# Patient Record
Sex: Male | Born: 1985 | Race: White | Hispanic: No | Marital: Married | Smoking: Never smoker
Health system: Southern US, Community
[De-identification: ages and names within clinical notes are randomized; demographics above are authoritative.]

## PROBLEM LIST (undated history)

## (undated) ENCOUNTER — Ambulatory Visit (HOSPITAL_COMMUNITY): Discharge status: Absent without leave | Payer: Self-pay

---
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
## 2012-08-09 ENCOUNTER — Emergency Department (HOSPITAL_COMMUNITY)
Admission: EM | Admit: 2012-08-09 | Discharge: 2012-08-09 | Disposition: A | Discharge status: Home / Self Care | Payer: BC Managed Care – PPO | Source: Home / Self Care | Attending: Family Medicine | Admitting: Family Medicine

## 2012-08-09 ENCOUNTER — Encounter (HOSPITAL_COMMUNITY): Payer: Self-pay

## 2012-08-09 DIAGNOSIS — IMO0002 Reserved for concepts with insufficient information to code with codable children: Secondary | ICD-10-CM

## 2012-08-09 DIAGNOSIS — R112 Nausea with vomiting, unspecified: Secondary | ICD-10-CM

## 2012-08-09 DIAGNOSIS — T733XXA Exhaustion due to excessive exertion, initial encounter: Secondary | ICD-10-CM

## 2012-08-09 LAB — POCT INFECTIOUS MONO SCREEN: Mono Screen: NEGATIVE

## 2012-08-09 NOTE — ED Notes (Signed)
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

## 2012-08-09 NOTE — ED Provider Notes (Signed)
History     CSN: [id number]  Arrival date & time 08/09/12  1005   First MD Initiated Contact with Patient 08/09/12 1140      Chief Complaint  Patient presents with  . Lymphadenopathy    (Consider location/radiation/quality/duration/timing/severity/associated sxs/prior treatment) The history is provided by the patient.   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  States no previous illness or feeling sick. States symptoms have since resolved.   History reviewed. No pertinent past medical history.  History reviewed. No pertinent past surgical history.  History reviewed. No pertinent family history.  History  Substance Use Topics  . Smoking status: Never Smoker   . Smokeless tobacco: Not on file  . Alcohol Use: Yes      Review of Systems  Constitutional: Negative.   Respiratory: Negative.   Cardiovascular: Negative.   Gastrointestinal: Negative.   Genitourinary: Negative.     Allergies  Review of patient's allergies indicates no known allergies.  Home Medications  No current outpatient prescriptions on file.  BP 136/93  Pulse 110  Temp 97.7 F (36.5 C) (Oral)  Resp 19  SpO2 100%  Physical Exam  Nursing note and vitals reviewed. Constitutional: He is oriented to person, place, and time. Vital signs are normal. He appears well-developed and well-nourished. He is active and cooperative.  HENT:  Head: Normocephalic.  Right Ear: Hearing, tympanic membrane, external ear and ear canal normal.  Left Ear: Hearing, tympanic membrane, external ear and ear canal normal.  Nose: Nose normal. Right sinus exhibits no maxillary sinus tenderness and no frontal sinus tenderness. Left sinus exhibits no maxillary sinus tenderness and no frontal sinus tenderness.  Eyes: Conjunctivae normal are normal. Pupils are equal, round, and reactive to light. No scleral icterus.  Neck:  Trachea normal and normal range of motion. Neck supple. No spinous process tenderness and no muscular tenderness present.  Cardiovascular: Normal rate, regular rhythm, normal heart sounds and normal pulses.   Pulmonary/Chest: Effort normal and breath sounds normal.  Abdominal: Soft. Normal appearance. There is no tenderness.  Lymphadenopathy:       Head (right side): No submental, no submandibular, no tonsillar, no preauricular, no posterior auricular and no occipital adenopathy present.       Head (left side): No submental, no submandibular, no tonsillar, no preauricular, no posterior auricular and no occipital adenopathy present.    He has no cervical adenopathy.  Neurological: He is alert and oriented to person, place, and time. He has normal strength. No cranial nerve deficit or sensory deficit. Coordination and gait normal. GCS eye subscore is 4. GCS verbal subscore is 5. GCS motor subscore is 6.  Skin: Skin is warm and dry. No rash noted.  Psychiatric: He has a normal mood and affect. His speech is normal and behavior is normal. Judgment and thought content normal. Cognition and memory are normal.    ED Course  Procedures (including critical care time)   Labs Reviewed  POCT INFECTIOUS MONO SCREEN   No results found.   1. Exhaustion due to excessive exertion   2. Nausea & vomiting       MDM  Increase flluid intake.  Recommended Carbs and Protein pre work out to prevent adverse reactions.  May take tylenol or motrin for muscle aches.          Johnsie Kindred, NP 08/09/12 2100

## 2012-08-09 NOTE — Discharge Instructions (Signed)
Overtraining Many athletes have one or more episodes of overtraining during their career. Overtraining can be a serious problem. It often has a negative effect on your performance. This condition is hard to diagnose. It is related to declining performance, fatigue, altered mood, muscle soreness, and a feeling of being burned out. The extent of the condition varies between athletes. Symptoms may have different origins: mind (psychological), body (physiological), or mind and body (psychosomatic). SYMPTOMS   Declining performance.   Fatigue.   Tension.   Sleep problems.   Muscle fatigue.   Anger.   Loss of appetite.   Loss of sexual interest.   Irregular menstrual periods.   Depression.   Abnormal sense perceptions.  CAUSES   High intensity, frequency, or duration of physical training.   Sleep problems.   Travel.   Use of certain medicines.   Drinking alcohol.  RISK INCREASES WITH:   Poor general health.   Poor general nutrition.   Mood state.   Type A personality.   Age.   Males.   Menstrual cycle.  PREVENTION It is important to include alternating aspects of training (periodization) in your training schedule. This reduces the risk of overtraining. Periodization includes planned changes in exercise volume that are coordinated with dates of competitions. This method maximizes performance and minimizes the risk of overtraining. Coaches often set such schedules with their athletes. However, if you design your own training schedule, it is important to use a similar alternating method. TREATMENT  The best way to treat overtraining is by not allowing it to happen. Plan training schedules that include changes in exercise volume. The only way to treat an existing case of overtraining is to rest. Rest is advised for about 2 weeks. Once activity is resumed, you should begin with a low exercise volume. Increase the frequency, intensity and length of exercise gradually. If  symptoms begin again during recovery, it is important to take a step back in your recovery progression. It is also important to seek medical attention, to rule out other medical conditions that may be the cause of your symptoms (malnutrition, depression, thyroid disease, anemia).  Document Released: 11/12/2005 Document Revised: 11/01/2011 Document Reviewed: 02/24/2009 [facility] Patient Information 2012 [place], [place].

## 2012-08-10 NOTE — ED Provider Notes (Signed)
Medical screening examination/treatment/procedure(s) were performed by resident physician or non-physician practitioner and as supervising physician I was immediately available for consultation/collaboration.   Barkley Bruns MD.    Linna Hoff, MD 08/10/12 [phone]

## 2013-10-05 ENCOUNTER — Ambulatory Visit (INDEPENDENT_AMBULATORY_CARE_PROVIDER_SITE_OTHER): Payer: BC Managed Care – PPO | Admitting: Physician Assistant

## 2013-10-05 VITALS — BP 118/62 | HR 71 | Temp 97.7°F | Resp 16 | Ht 72.0 in | Wt 140.0 lb

## 2013-10-05 DIAGNOSIS — Z0000 Encounter for general adult medical examination without abnormal findings: Secondary | ICD-10-CM

## 2013-10-05 NOTE — Progress Notes (Signed)
Patient ID: Benjamin Kent MRN: [id number], DOB: 1986/10/31 26 y.o. Date of Encounter: 10/05/2013, 12:15 PM  Primary Physician: No PCP Per Patient  Chief Complaint: Physical (CPE)  HPI: 27 y.o. male with history noted below here for CPE. Doing well. No issues/complaints. 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 No issues with respirator. Never a smoker. Generally healthy. Spent 3 years in the [employer]. Majored in criminal justice at [employer].   Review of Systems: Consitutional: No fever, chills, fatigue, night sweats, lymphadenopathy, or weight changes. Eyes: No visual changes, eye redness, or discharge. ENT/Mouth: Ears: No otalgia, tinnitus, hearing loss, discharge. Nose: No congestion, rhinorrhea, sinus pain, or epistaxis. Throat: No sore throat, post nasal drip, or teeth pain. Cardiovascular: No CP, palpitations, diaphoresis, DOE, edema, orthopnea, PND. Respiratory: No cough, hemoptysis, SOB, or wheezing. Gastrointestinal: No anorexia, dysphagia, reflux, pain, nausea, vomiting, hematemesis, diarrhea, constipation, BRBPR, or melena. Genitourinary: No dysuria, frequency, urgency, hematuria, incontinence, nocturia, decreased urinary stream, discharge, impotence, or testicular pain/masses. Musculoskeletal: No decreased ROM, myalgias, stiffness, joint swelling, or weakness. Skin: No rash, erythema, lesion changes, pain, warmth, jaundice, or pruritis. Neurological: No headache, dizziness, syncope, seizures, tremors, memory loss, coordination problems, or paresthesias. Psychological: No anxiety, depression, hallucinations, SI/HI. Endocrine: No fatigue, polydipsia, polyphagia, polyuria, or known diabetes.   History reviewed. No pertinent past medical history.   History reviewed. No pertinent past surgical history.  Home Meds:  Prior to Admission  medications   Not on File    Allergies: No Known Allergies  History   Social History  . Marital Status: Single    Spouse Name: N/A    Number of Children: N/A  . Years of Education: N/A   Occupational History  . Not on file.   Social History Main Topics  . Smoking status: Never Smoker   . Smokeless tobacco: Not on file  . Alcohol Use: Yes  . Drug Use: No  . Sexual Activity: Not on file   Other Topics Concern  . Not on file   Social History Narrative  . No narrative on file    Family History  Problem Relation Age of Onset  . Hyperlipidemia Father   . Diabetes Maternal Grandmother     Physical Exam: Blood pressure 118/62, pulse 71, temperature 97.7 F (36.5 C), temperature source Oral, resp. rate 16, height 6' (1.829 m), weight 140 lb (63.504 kg).  General: Well developed, well nourished, in no acute distress. HEENT: Normocephalic, atraumatic. Conjunctiva pink, sclera non-icteric. Pupils 2 mm constricting to 1 mm, round, regular, and equally reactive to light and accomodation. EOMI. Internal auditory canal clear. TMs with good cone of light and without pathology. Nasal mucosa pink. Nares are without discharge. No sinus tenderness. Oral mucosa pink. Dentition normal. Pharynx without exudate.   Neck: Supple. Trachea midline. No thyromegaly. Full ROM. No lymphadenopathy. Lungs: Clear to auscultation bilaterally without wheezes, rales, or rhonchi. Breathing is of normal effort and unlabored. Cardiovascular: RRR with S1 S2. No murmurs, rubs, or gallops appreciated. Distal pulses 2+ symmetrically. No carotid or abdominal bruits. Abdomen: Soft, non-tender, non-distended with normoactive bowel sounds. No hepatosplenomegaly or masses. No rebound/guarding. No CVA tenderness. Without hernias.  Genitourinary: Circumcised male. No penile lesions. Testes descended bilaterally, and smooth without tenderness or masses.  Musculoskeletal: Full range of motion and 5/5 strength throughout.  Without swelling, atrophy, tenderness, crepitus, or warmth. Extremities without  clubbing, cyanosis, or edema. Calves supple. Skin: Warm and moist without erythema, ecchymosis, wounds, or rash. Neuro: A+Ox3. CN II-XII grossly intact. Moves all extremities spontaneously. Full sensation throughout. Normal gait. DTR 2+ throughout upper and lower extremities. Finger to nose intact. Psych:  Responds to questions appropriately with a normal affect.   Studies:   Declines labs. 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 Peak flow: 650. Predicted approximately 650.   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  -Healthy young adult male physical -Peak flow unremarkable -Spirometry unremarkable -Healthy diet and exercise -Smart lifestyle choices -Age appropriate anticipatory guidance  Signed, Eula Listen, PA-C [facility] [place], [place] [address] [phone] 10/05/2013 12:15 PM

## 2016-01-03 ENCOUNTER — Ambulatory Visit
Admission: RE | Admit: 2016-01-03 | Discharge: 2016-01-03 | Disposition: A | Discharge status: Home / Self Care | Payer: No Typology Code available for payment source | Source: Ambulatory Visit | Attending: Occupational Medicine | Admitting: Occupational Medicine

## 2016-01-03 ENCOUNTER — Other Ambulatory Visit: Payer: Self-pay | Admitting: Occupational Medicine

## 2016-01-03 DIAGNOSIS — Z021 Encounter for pre-employment examination: Secondary | ICD-10-CM

## 2016-06-15 ENCOUNTER — Ambulatory Visit (HOSPITAL_COMMUNITY)
Admission: EM | Admit: 2016-06-15 | Discharge: 2016-06-15 | Disposition: A | Discharge status: Home / Self Care | Payer: 59 | Attending: Family Medicine | Admitting: Family Medicine

## 2016-06-15 ENCOUNTER — Encounter (HOSPITAL_COMMUNITY): Payer: Self-pay | Admitting: Emergency Medicine

## 2016-06-15 DIAGNOSIS — L03114 Cellulitis of left upper limb: Secondary | ICD-10-CM

## 2016-06-15 MED ORDER — ONDANSETRON 8 MG PO TBDP: 8.0000 mg | ORAL_TABLET | Freq: Three times a day (TID) | ORAL | Status: DC | PRN

## 2016-06-15 MED ORDER — CEFTRIAXONE SODIUM 1 G IJ SOLR
INTRAMUSCULAR | Status: AC
  Filled 2016-06-15: qty 10

## 2016-06-15 MED ORDER — LIDOCAINE HCL (PF) 1 % IJ SOLN
INTRAMUSCULAR | Status: AC
  Filled 2016-06-15: qty 2

## 2016-06-15 MED ORDER — CEFTRIAXONE SODIUM 1 G IJ SOLR
1.0000 g | Freq: Once | INTRAMUSCULAR | Status: AC
  Administered 2016-06-15: 1 g via INTRAMUSCULAR

## 2016-06-15 MED ORDER — ONDANSETRON 4 MG PO TBDP
ORAL_TABLET | ORAL | Status: AC
  Filled 2016-06-15: qty 2

## 2016-06-15 MED ORDER — ONDANSETRON 4 MG PO TBDP
4.0000 mg | ORAL_TABLET | Freq: Once | ORAL | Status: AC
  Administered 2016-06-15: 4 mg via ORAL

## 2016-06-15 NOTE — Discharge Instructions (Signed)

## 2016-06-15 NOTE — ED Notes (Signed)
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 The patient stated that he did see city medical services and was prescribed an oral antibiotic that he could not remember the name of the antibiotic. He stated that this am he started having nausea.

## 2016-06-17 ENCOUNTER — Encounter (HOSPITAL_COMMUNITY): Payer: Self-pay | Admitting: Emergency Medicine

## 2016-06-17 ENCOUNTER — Ambulatory Visit (HOSPITAL_COMMUNITY)
Admission: EM | Admit: 2016-06-17 | Discharge: 2016-06-17 | Disposition: A | Discharge status: Home / Self Care | Payer: 59 | Attending: Physician Assistant | Admitting: Physician Assistant

## 2016-06-17 DIAGNOSIS — L0291 Cutaneous abscess, unspecified: Secondary | ICD-10-CM

## 2016-06-17 DIAGNOSIS — L02414 Cutaneous abscess of left upper limb: Secondary | ICD-10-CM | POA: Insufficient documentation

## 2016-06-17 MED ORDER — LIDOCAINE HCL 2 % IJ SOLN
INTRAMUSCULAR | Status: AC
  Filled 2016-06-17: qty 20

## 2016-06-17 NOTE — ED Notes (Signed)
Applied dressing to left forearm.

## 2016-06-17 NOTE — ED Provider Notes (Signed)
CSN: [id number]     Arrival date & time 06/17/16  1158 History   First MD Initiated Contact with Patient 06/17/16 1214     Chief Complaint  Patient presents with  . 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 Initially treated with ceftriaxone and a course of Bactrim. Patient states that the redness in the arm has receded quite a bit. Other symptoms include: No fever Pain score: 3 FAMILY HISTORY: Hypertension    History reviewed. No pertinent past medical history. History reviewed. No pertinent surgical history. Family History  Problem Relation Age of Onset  . Hyperlipidemia Father   . Diabetes Maternal Grandmother    Social History  Substance Use Topics  . Smoking status: Never Smoker  . Smokeless tobacco: Never Used  . Alcohol use Yes    Review of Systems  Denies: HEADACHE, NAUSEA, ABDOMINAL PAIN, CHEST PAIN, CONGESTION, DYSURIA, SHORTNESS OF BREATH  Allergies  Review of patient's allergies indicates no known allergies.  Home Medications   Prior to Admission medications   Medication Sig Start Date End Date Taking? Authorizing Provider  bacitracin 500 UNIT/GM ointment Apply 1 application topically 2 (two) times daily.    Historical Provider, MD  ondansetron (ZOFRAN ODT) 8 MG disintegrating tablet Take 1 tablet (8 mg total) by mouth every 8 (eight) hours as needed for nausea or vomiting. 06/15/16   Deatra Canter, FNP  traMADol (ULTRAM) 50 MG tablet Take by mouth every 6 (six) hours as needed.    Historical Provider, MD   Meds Ordered and Administered this Visit  Medications - No data to display  BP 133/75 (BP Location:  Right Arm)   Pulse 77   Temp 98.3 F (36.8 C)   Resp 16   Ht 6\' 2"  (1.88 m)   Wt 160 lb (72.6 kg)   SpO2 99%   BMI 20.54 kg/m  No data found.   Physical Exam   NURSES NOTES AND VITAL SIGNS REVIEWED. CONSTITUTIONAL: Well developed, well nourished, no acute distress HEENT: normocephalic, atraumatic EYES: Conjunctiva normal NECK:normal ROM, supple, no adenopathy PULMONARY:No respiratory distress, normal effort ABDOMINAL: Soft, ND, NT BS+, No CVAT MUSCULOSKELETAL: Normal ROM of all extremities, Left forearm, tender red swollen lesion measures about 3 cm fluctuant.  SKIN: warm and dry without rash PSYCHIATRIC: Mood and affect, behavior are normal  Urgent Care Course   Clinical Course    .Marland KitchenIncision and Drainage Date/Time: 06/17/2016 12:55 PM Performed by: Tharon Aquas Authorized by: Barbra Sarks C   Consent:    Consent obtained:  Verbal   Consent given by:  Patient   Alternatives discussed:  Observation Location:    Type:  Abscess   Location:  Upper extremity   Upper extremity location:  Arm   Arm location:  L lower arm Pre-procedure details:    Skin preparation:  Antiseptic wash and Betadine Anesthesia (see MAR for exact dosages):    Anesthesia method:  Local infiltration   Local anesthetic:  Lidocaine 1% WITH epi Procedure type:    Complexity:  Simple Procedure details:    Needle aspiration: no     Incision types:  Single straight   Incision depth:  Subcutaneous   Scalpel blade:  11  Wound management:  Probed and deloculated   Drainage:  Purulent   Drainage amount:  Moderate   Wound treatment:  Drain placed   Packing materials:  1/4 in gauze Post-procedure details:    Patient tolerance of procedure:  Tolerated well, no immediate complications   (including critical care time)  Labs Review Labs Reviewed  AEROBIC CULTURE (SUPERFICIAL SPECIMEN)    Imaging Review No results found.   Visual Acuity Review  Right Eye Distance:   Left Eye  Distance:   Bilateral Distance:    Right Eye Near:   Left Eye Near:    Bilateral Near:      Continue antibiotics, hot compresses to arm, keep covered.  Packing to be removed in 3 days.    MDM   1. Abscess     Patient is reassured that there are no issues that require transfer to higher level of care at this time or additional tests. Patient is advised to continue home symptomatic treatment. Patient is advised that if there are new or worsening symptoms to attend the emergency department, contact primary care provider, or return to UC. Instructions of care provided discharged home in stable condition.    THIS NOTE WAS GENERATED USING A VOICE RECOGNITION SOFTWARE PROGRAM. ALL REASONABLE EFFORTS  WERE MADE TO PROOFREAD THIS DOCUMENT FOR ACCURACY.  I have verbally reviewed the discharge instructions with the patient. A printed AVS was given to the patient.  All questions were answered prior to discharge.      Tharon Aquas, PA 06/17/16 1256

## 2016-06-17 NOTE — ED Triage Notes (Signed)
Pt. Here for cellulitis, here for recheck.

## 2016-06-20 LAB — AEROBIC CULTURE  (SUPERFICIAL SPECIMEN)

## 2016-06-20 LAB — AEROBIC CULTURE W GRAM STAIN (SUPERFICIAL SPECIMEN)

## 2016-06-21 ENCOUNTER — Telehealth (HOSPITAL_COMMUNITY): Payer: Self-pay | Admitting: *Deleted

## 2016-08-19 ENCOUNTER — Ambulatory Visit (HOSPITAL_COMMUNITY)
Admission: EM | Admit: 2016-08-19 | Discharge: 2016-08-19 | Disposition: A | Discharge status: Home / Self Care | Payer: 59 | Attending: Internal Medicine | Admitting: Internal Medicine

## 2016-08-19 ENCOUNTER — Encounter (HOSPITAL_COMMUNITY): Payer: Self-pay | Admitting: Emergency Medicine

## 2016-08-19 DIAGNOSIS — S46912A Strain of unspecified muscle, fascia and tendon at shoulder and upper arm level, left arm, initial encounter: Secondary | ICD-10-CM | POA: Diagnosis not present

## 2016-08-19 DIAGNOSIS — S46812A Strain of other muscles, fascia and tendons at shoulder and upper arm level, left arm, initial encounter: Secondary | ICD-10-CM

## 2016-08-19 MED ORDER — METHOCARBAMOL 500 MG PO TABS: 500.0000 mg | ORAL_TABLET | Freq: Three times a day (TID) | ORAL | 0 refills | Status: DC | PRN

## 2016-08-19 MED ORDER — KETOROLAC TROMETHAMINE 30 MG/ML IJ SOLN
INTRAMUSCULAR | Status: AC
  Filled 2016-08-19: qty 1

## 2016-08-19 MED ORDER — METHYLPREDNISOLONE ACETATE 80 MG/ML IJ SUSP
80.0000 mg | Freq: Once | INTRAMUSCULAR | Status: AC
  Administered 2016-08-19: 80 mg via INTRAMUSCULAR

## 2016-08-19 MED ORDER — METHYLPREDNISOLONE 4 MG PO TBPK: ORAL_TABLET | ORAL | 0 refills | Status: DC

## 2016-08-19 MED ORDER — METHYLPREDNISOLONE ACETATE 80 MG/ML IJ SUSP
INTRAMUSCULAR | Status: AC
  Filled 2016-08-19: qty 1

## 2016-08-19 MED ORDER — KETOROLAC TROMETHAMINE 30 MG/ML IJ SOLN
30.0000 mg | Freq: Once | INTRAMUSCULAR | Status: AC
  Administered 2016-08-19: 30 mg via INTRAMUSCULAR

## 2016-08-19 NOTE — ED Triage Notes (Signed)
[facility] a [facility] of right shoulder pain secondary to lifting weights in the gym 3 days ago. The patient stated that he was doing incline presses and felt a "pop" in his shoulder. The patient stated that the pain starts in his neck on the left side and radiates into his left shoulder.

## 2016-08-19 NOTE — Discharge Instructions (Signed)
No heavy lifting.    Can continue using heat or ice.    OTC tylenol as directed.

## 2016-08-19 NOTE — ED Provider Notes (Signed)
[facility]    CSN: [id number] Arrival date & time: 08/19/16  1205  First Provider Contact:  First MD Initiated Contact with Patient 08/19/16 1307        History   Chief Complaint Chief Complaint  Patient presents with  . Shoulder Pain    HPI Benjamin Kent is a 30 y.o. male.   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  States that he was in the gym doing incline presses Friday and he felt a "pop" in the left scapular area.  Pain aggravated with turning his head and neck flexion/extension.  Denies left shoulder pain or cervical radiculopathy. No previous problems before onset.  Has used heat/ice, OTC nsaid's, tylenol without much improvement.     History reviewed. No pertinent past medical history.  There are no active problems to display for this patient.   History reviewed. No pertinent surgical history.     Home Medications    Prior to Admission medications   Medication Sig Start Date End Date Taking? Authorizing Provider  bacitracin 500 UNIT/GM ointment Apply 1 application topically 2 (two) times daily.    Historical Provider, MD  ondansetron (ZOFRAN ODT) 8 MG disintegrating tablet Take 1 tablet (8 mg total) by mouth every 8 (eight) hours as needed for nausea or vomiting. 06/15/16   Deatra CanterWilliam J Oxford, FNP  traMADol (ULTRAM) 50 MG tablet Take by mouth every 6 (six) hours as needed.    Historical Provider, MD    Family History Family History  Problem Relation Age of Onset  . Hyperlipidemia Father   . Diabetes Maternal Grandmother     Social History Social History  Substance Use Topics  . Smoking status: Never Smoker  . Smokeless tobacco: Never Used  . Alcohol use Yes     Allergies   Review of patient's allergies indicates no known allergies.   Review of Systems Review of Systems  Constitutional: Negative.   HENT: Negative.   Eyes: Negative.   Respiratory: Negative.    Cardiovascular: Negative.   Gastrointestinal: Negative.   Genitourinary: Negative.   Skin: Negative.   Neurological: Negative.   Psychiatric/Behavioral: Negative.      Physical Exam Triage Vital Signs ED Triage Vitals [08/19/16 1236]  Enc Vitals Group     BP 124/76     Pulse Rate 72     Resp 18     Temp 98.5 F (36.9 C)     Temp Source Oral     SpO2 100 %     Weight      Height      Head Circumference      Peak Flow      Pain Score 7     Pain Loc      Pain Edu?      Excl. in GC?    No data found.   Updated Vital Signs BP 124/76 (BP Location: Left Arm)   Pulse 72   Temp 98.5 F (36.9 C) (Oral)   Resp 18   SpO2 100%   Visual Acuity Right Eye Distance:   Left Eye Distance:   Bilateral Distance:    Right Eye Near:   Left Eye Near:    Bilateral Near:     Physical Exam  Constitutional: He is oriented to person, place, and time. He appears well-developed. No distress.  HENT:  Head: Normocephalic and atraumatic.  Eyes: EOM are normal. Pupils are equal,  round, and reactive to light.  Neck:  Some limitation with Cervical spine ROM due to left trapezius and scapular soreness.    Pulmonary/Chest: Effort normal. No respiratory distress.  Abdominal: He exhibits no distension.  Musculoskeletal: He exhibits no deformity.  Moderately tender over the left trapezius muscle and medial left scapular border with spasm.  bilat shoulders good ROM.  Neg impingement test.  Neg apprehension.  NVI.  No focal motor deficits.   Neurological: He is alert and oriented to person, place, and time.  Skin: Skin is warm and dry.  Psychiatric: He has a normal mood and affect.     UC Treatments / Results  Labs (all labs ordered are listed, but only abnormal results are displayed) Labs Reviewed - No data to display  EKG  EKG Interpretation None       Radiology No results found.  Procedures Procedures (including critical care time)  Medications Ordered in UC Medications    ketorolac (TORADOL) 30 MG/ML injection 30 mg (not administered)  methylPREDNISolone acetate (DEPO-MEDROL) injection 80 mg (not administered)     Initial Impression / Assessment and Plan / UC Course  I have reviewed the triage vital signs and the nursing notes.  Pertinent labs & imaging results that were available during my care of the patient were reviewed by me and considered in my medical decision making (see chart for details).  Clinical Course      Final Clinical Impressions(s) / UC Diagnoses   Final diagnoses:  Muscle strain of left scapular region, initial encounter  Strain of left trapezius muscle, initial encounter    New Prescriptions New Prescriptions   METHOCARBAMOL (ROBAXIN) 500 MG TABLET    Take 1 tablet (500 mg total) by mouth every 8 (eight) hours as needed for muscle spasms.   METHYLPREDNISOLONE (MEDROL DOSEPAK) 4 MG TBPK TABLET    6 day taper to be taken as directed.  Start 25 September.   Patient given IM toradol and depomedrol injections today.  Also given the above scripts. Start prednisone taper tomorrow.  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  He will call [facility] Monday morning to schedule an appointment to be seen within the next couple of days.  All questions answered.     Naida Sleight, PA-C 08/19/16 1344

## 2016-10-08 ENCOUNTER — Ambulatory Visit
Admission: RE | Admit: 2016-10-08 | Discharge: 2016-10-08 | Disposition: A | Discharge status: Home / Self Care | Payer: Worker's Compensation | Source: Ambulatory Visit | Attending: Nurse Practitioner | Admitting: Nurse Practitioner

## 2016-10-08 ENCOUNTER — Other Ambulatory Visit: Payer: Self-pay | Admitting: Nurse Practitioner

## 2016-10-08 DIAGNOSIS — R52 Pain, unspecified: Secondary | ICD-10-CM

## 2016-10-08 DIAGNOSIS — M25512 Pain in left shoulder: Secondary | ICD-10-CM

## 2017-04-08 DIAGNOSIS — Z0000 Encounter for general adult medical examination without abnormal findings: Secondary | ICD-10-CM | POA: Diagnosis not present

## 2017-09-09 DIAGNOSIS — M6248 Contracture of muscle, other site: Secondary | ICD-10-CM | POA: Diagnosis not present

## 2017-09-09 DIAGNOSIS — M7522 Bicipital tendinitis, left shoulder: Secondary | ICD-10-CM | POA: Diagnosis not present

## 2017-09-20 DIAGNOSIS — M542 Cervicalgia: Secondary | ICD-10-CM | POA: Diagnosis not present

## 2017-09-20 DIAGNOSIS — M25512 Pain in left shoulder: Secondary | ICD-10-CM | POA: Diagnosis not present

## 2017-10-24 DIAGNOSIS — L738 Other specified follicular disorders: Secondary | ICD-10-CM | POA: Diagnosis not present

## 2017-10-24 DIAGNOSIS — B360 Pityriasis versicolor: Secondary | ICD-10-CM | POA: Diagnosis not present

## 2018-01-01 DIAGNOSIS — H43391 Other vitreous opacities, right eye: Secondary | ICD-10-CM | POA: Diagnosis not present

## 2018-01-15 ENCOUNTER — Ambulatory Visit
Admission: RE | Admit: 2018-01-15 | Discharge: 2018-01-15 | Disposition: A | Discharge status: Home / Self Care | Payer: 59 | Source: Ambulatory Visit | Attending: Family Medicine | Admitting: Family Medicine

## 2018-01-15 ENCOUNTER — Other Ambulatory Visit: Payer: Self-pay | Admitting: Family Medicine

## 2018-01-15 DIAGNOSIS — R0789 Other chest pain: Secondary | ICD-10-CM

## 2018-01-15 DIAGNOSIS — R079 Chest pain, unspecified: Secondary | ICD-10-CM | POA: Diagnosis not present

## 2018-08-11 ENCOUNTER — Encounter (HOSPITAL_COMMUNITY): Payer: Self-pay

## 2018-08-11 ENCOUNTER — Ambulatory Visit (HOSPITAL_COMMUNITY)
Admission: EM | Admit: 2018-08-11 | Discharge: 2018-08-11 | Disposition: A | Discharge status: Home / Self Care | Payer: 59 | Attending: Family Medicine | Admitting: Family Medicine

## 2018-08-11 DIAGNOSIS — S61411A Laceration without foreign body of right hand, initial encounter: Secondary | ICD-10-CM

## 2018-08-11 DIAGNOSIS — W540XXA Bitten by dog, initial encounter: Secondary | ICD-10-CM

## 2018-08-11 MED ORDER — DOXYCYCLINE HYCLATE 100 MG PO CAPS: 100.0000 mg | ORAL_CAPSULE | Freq: Two times a day (BID) | ORAL | 0 refills | Status: AC

## 2018-08-11 NOTE — Discharge Instructions (Signed)
WOUND CARE Please return in 7-10 days to have your stitches/staples removed or sooner if you have concerns.  Keep area clean and dry for 24 hours. Do not remove bandage, if applied. Begin doxycycline  After 24 hours, remove bandage and wash wound gently with mild soap and warm water. Reapply a new bandage after cleaning wound, if directed.  Continue daily cleansing with soap and water until stitches/staples are removed.  Do not apply any ointments or creams to the wound while stitches/staples are in place, as this may cause delayed healing.  Notify the office if you experience any of the following signs of infection: Swelling, redness, pus drainage, streaking, fever >101.0 F  Notify the office if you experience excessive bleeding that does not stop after 15-20 minutes of constant, firm pressure.

## 2018-08-11 NOTE — ED Provider Notes (Signed)
[facility]    CSN: [id number] Arrival date & time: 08/11/18  1546     History   Chief Complaint Chief Complaint  Patient presents with  . Laceration    Right Hand    HPI Benjamin Kent is a 32 y.o. male no significant past medical history presenting today for evaluation of laceration to his right hand.  Patient states that he was bit by his dog approximately 30 minutes prior to arrival.  States that the dog is up-to-date on his vaccines, patient is up-to-date on tetanus.  Denies difficulty moving hand or fingers.  Did not clean it at home.  Denies numbness or tingling.  Denies fevers.  HPI  History reviewed. No pertinent past medical history.  There are no active problems to display for this patient.   History reviewed. No pertinent surgical history.     Home Medications    Prior to Admission medications   Medication Sig Start Date End Date Taking? Authorizing Provider  bacitracin 500 UNIT/GM ointment Apply 1 application topically 2 (two) times daily.    [provider]  doxycycline (VIBRAMYCIN) 100 MG capsule Take 1 capsule (100 mg total) by mouth 2 (two) times daily for 10 days. 08/11/18 08/21/18  Brodan Grewell C, PA-C  methocarbamol (ROBAXIN) 500 MG tablet Take 1 tablet (500 mg total) by mouth every 8 (eight) hours as needed for muscle spasms. 08/19/16   Naida Sleightwens, James M, PA-C  methylPREDNISolone (MEDROL DOSEPAK) 4 MG TBPK tablet 6 day taper to be taken as directed.  Start 25 September. 08/20/16   Naida Sleightwens, James M, PA-C  ondansetron (ZOFRAN ODT) 8 MG disintegrating tablet Take 1 tablet (8 mg total) by mouth every 8 (eight) hours as needed for nausea or vomiting. 06/15/16   Deatra Canterxford, William J, FNP  traMADol (ULTRAM) 50 MG tablet Take by mouth every 6 (six) hours as needed.    [provider]    Family History Family History  Problem Relation Age of Onset  . Hyperlipidemia Father   . Diabetes Maternal Grandmother     Social History Social  History   Tobacco Use  . Smoking status: Never Smoker  . Smokeless tobacco: Never Used  Substance Use Topics  . Alcohol use: Yes  . Drug use: No     Allergies   Patient has no known allergies.   Review of Systems Review of Systems  Constitutional: Negative for fatigue and fever.  Eyes: Negative for redness, itching and visual disturbance.  Respiratory: Negative for shortness of breath.   Cardiovascular: Negative for chest pain and leg swelling.  Gastrointestinal: Negative for nausea and vomiting.  Musculoskeletal: Negative for arthralgias and myalgias.  Skin: Positive for wound. Negative for color change and rash.  Neurological: Negative for dizziness, syncope, weakness, light-headedness and headaches.     Physical Exam Triage Vital Signs ED Triage Vitals [08/11/18 1557]  Enc Vitals Group     BP 113/70     Pulse Rate 76     Resp 20     Temp 97.9 F (36.6 C)     Temp Source Oral     SpO2 98 %     Weight      Height      Head Circumference      Peak Flow      Pain Score      Pain Loc      Pain Edu?      Excl. in GC?    No data found.  Updated  Vital Signs BP 113/70 (BP Location: Left Arm)   Pulse 76   Temp 97.9 F (36.6 C) (Oral)   Resp 20   SpO2 98%   Visual Acuity Right Eye Distance:   Left Eye Distance:   Bilateral Distance:    Right Eye Near:   Left Eye Near:    Bilateral Near:     Physical Exam  Constitutional: He is oriented to person, place, and time. He appears well-developed and well-nourished.  No acute distress  HENT:  Head: Normocephalic and atraumatic.  Nose: Nose normal.  Eyes: Conjunctivae are normal.  Neck: Neck supple.  Cardiovascular: Normal rate.  Pulmonary/Chest: Effort normal. No respiratory distress.  Abdominal: He exhibits no distension.  Musculoskeletal: Normal range of motion.  Neurological: He is alert and oriented to person, place, and time.  Skin: Skin is warm and dry.  2 cm linear laceration to palmar aspect of  hand, subcutaneous tissue exposed, no tendon or muscle exposure involvement  Psychiatric: He has a normal mood and affect.  Nursing note and vitals reviewed.    UC Treatments / Results  Labs (all labs ordered are listed, but only abnormal results are displayed) Labs Reviewed - No data to display  EKG None  Radiology No results found.  Procedures Laceration Repair Date/Time: 08/11/2018 4:47 PM Performed by: Marvina Danner, Junius Creamer, PA-C Authorized by: Elvina Sidle, MD   Consent:    Consent obtained:  Verbal   Consent given by:  Patient   Risks discussed:  Infection, pain, need for additional repair and poor wound healing   Alternatives discussed:  No treatment Anesthesia (see MAR for exact dosages):    Anesthesia method:  Local infiltration   Local anesthetic:  Lidocaine 2% w/o epi Laceration details:    Location:  Hand   Hand location:  R palm   Length (cm):  2   Depth (mm):  3 Repair type:    Repair type:  Simple Pre-procedure details:    Preparation:  Patient was prepped and draped in usual sterile fashion Exploration:    Hemostasis achieved with:  Direct pressure   Wound exploration: wound explored through full range of motion     Wound extent: no foreign bodies/material noted, no muscle damage noted and no tendon damage noted     Contaminated: no   Treatment:    Area cleansed with:  Saline and Shur-Clens   Amount of cleaning:  Standard   Irrigation solution:  Sterile water   Irrigation volume:  200   Irrigation method:  Syringe   Visualized foreign bodies/material removed: no   Skin repair:    Repair method:  Sutures   Suture size:  4-0   Suture material:  Prolene   Suture technique:  Simple interrupted   Number of sutures:  4 Approximation:    Approximation:  Loose Post-procedure details:    Dressing: Gauze and Ace wrap.   Patient tolerance of procedure:  Tolerated well, no immediate complications   (including critical care time)  Medications  Ordered in UC Medications - No data to display  Initial Impression / Assessment and Plan / UC Course  I have reviewed the triage vital signs and the nursing notes.  Pertinent labs & imaging results that were available during my care of the patient were reviewed by me and considered in my medical decision making (see chart for details).     Laceration repaired given the depth, will put on empiric doxycycline to prevent infection.  Tetanus up-to-date.  Will  have patient monitor for development of infection and return if so.  Discussed wound care.  Return in 7 to 10 days for removal.  Discussed strict return precautions. Patient verbalized understanding and is agreeable with plan.  Final Clinical Impressions(s) / UC Diagnoses   Final diagnoses:  Laceration of right hand without foreign body, initial encounter  Dog bite, initial encounter     Discharge Instructions     WOUND CARE Please return in 7-10 days to have your stitches/staples removed or sooner if you have concerns. Marland Kitchen Keep area clean and dry for 24 hours. Do not remove bandage, if applied. Begin doxycycline . After 24 hours, remove bandage and wash wound gently with mild soap and warm water. Reapply a new bandage after cleaning wound, if directed. . Continue daily cleansing with soap and water until stitches/staples are removed. . Do not apply any ointments or creams to the wound while stitches/staples are in place, as this may cause delayed healing. . Notify the office if you experience any of the following signs of infection: Swelling, redness, pus drainage, streaking, fever >101.0 F . Notify the office if you experience excessive bleeding that does not stop after 15-20 minutes of constant, firm pressure.   ED Prescriptions    Medication Sig Dispense Auth. Provider   doxycycline (VIBRAMYCIN) 100 MG capsule Take 1 capsule (100 mg total) by mouth 2 (two) times daily for 10 days. 20 capsule Natlie Asfour C, PA-C      Controlled Substance Prescriptions [place] Controlled Substance Registry consulted? Not Applicable   Lew Dawes, [place] 08/11/18 1650

## 2018-08-11 NOTE — ED Triage Notes (Signed)
Pt presents with right hand laceration from dog bite.

## 2018-08-26 ENCOUNTER — Ambulatory Visit
Admission: RE | Admit: 2018-08-26 | Discharge: 2018-08-26 | Disposition: A | Discharge status: Home / Self Care | Payer: No Typology Code available for payment source | Source: Ambulatory Visit | Attending: Family Medicine | Admitting: Family Medicine

## 2018-08-26 ENCOUNTER — Other Ambulatory Visit: Payer: Self-pay | Admitting: Family Medicine

## 2018-08-26 DIAGNOSIS — R52 Pain, unspecified: Secondary | ICD-10-CM

## 2019-04-16 ENCOUNTER — Other Ambulatory Visit: Payer: Self-pay | Admitting: Physician Assistant

## 2019-04-16 DIAGNOSIS — R1011 Right upper quadrant pain: Secondary | ICD-10-CM

## 2019-05-05 ENCOUNTER — Other Ambulatory Visit: Payer: No Typology Code available for payment source

## 2019-05-12 ENCOUNTER — Ambulatory Visit
Admission: RE | Admit: 2019-05-12 | Discharge: 2019-05-12 | Disposition: A | Discharge status: Home / Self Care | Payer: No Typology Code available for payment source | Source: Ambulatory Visit | Attending: Physician Assistant | Admitting: Physician Assistant

## 2019-05-12 ENCOUNTER — Other Ambulatory Visit: Payer: Self-pay

## 2019-05-12 DIAGNOSIS — R1011 Right upper quadrant pain: Secondary | ICD-10-CM

## 2021-01-14 IMAGING — US ULTRASOUND ABDOMEN COMPLETE
1 series · 14 of 25 positions shown · non-contrast
Comparison: None.

CLINICAL DATA: Right upper quadrant pain for 1 year.

EXAM:
ABDOMEN ULTRASOUND COMPLETE

[Series 1: ultrasound abdomen complete · 0.17mm/px · 14 of 94 slices shown]
[im 1/94]
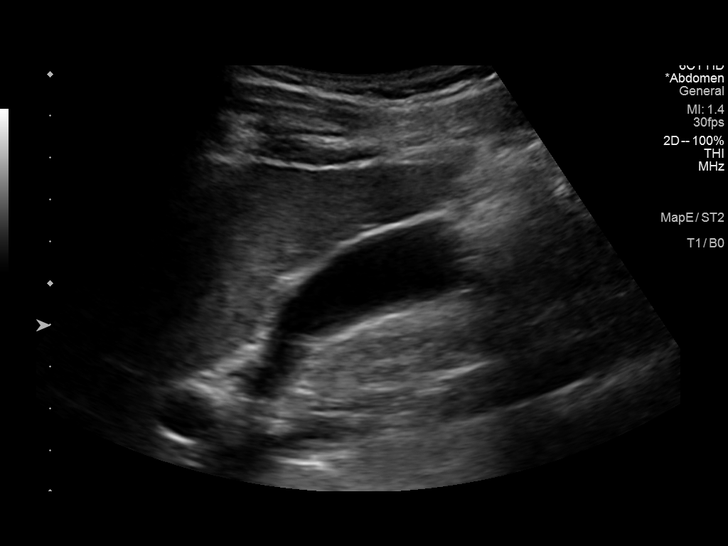
[im 8/94]
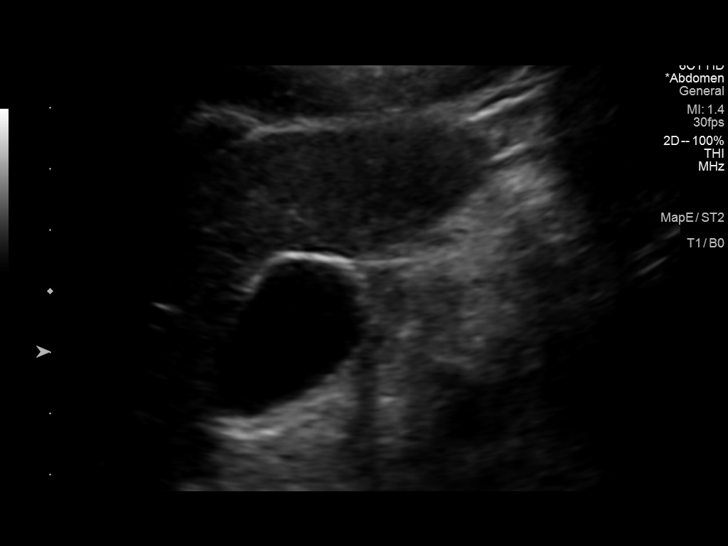
[im 16/94]
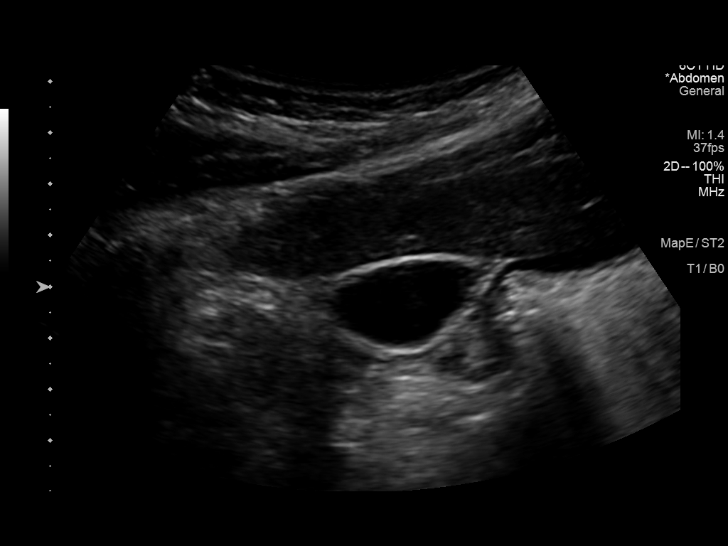
[im 24/94]
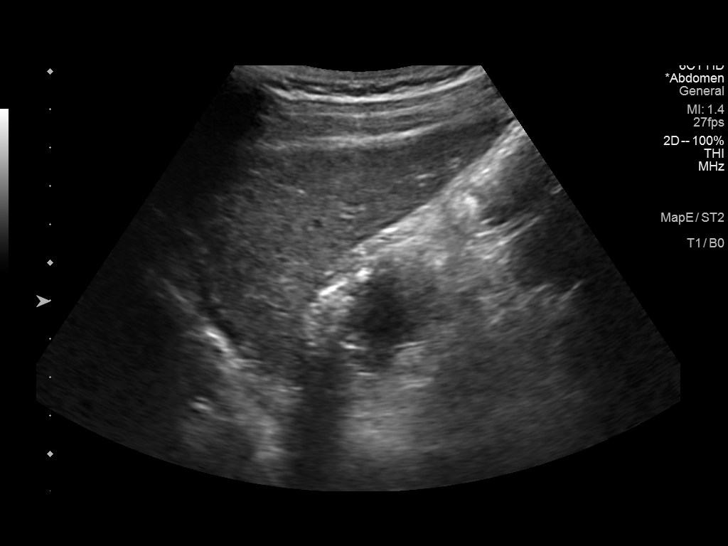
[im 32/94]
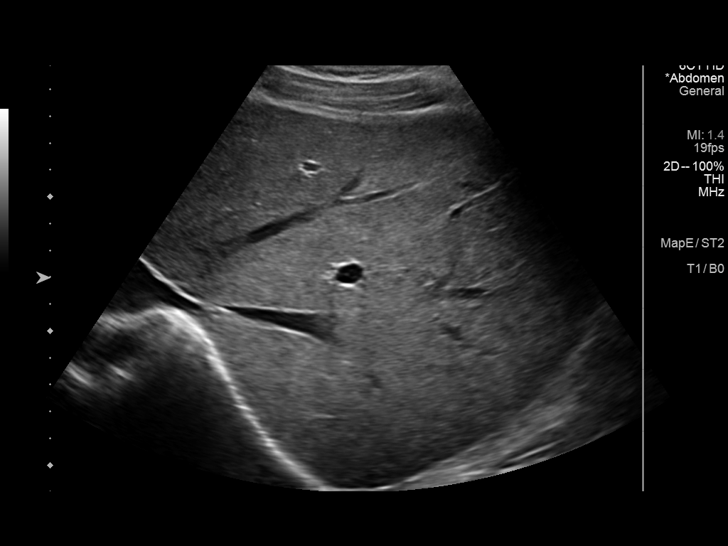
[im 35/94]
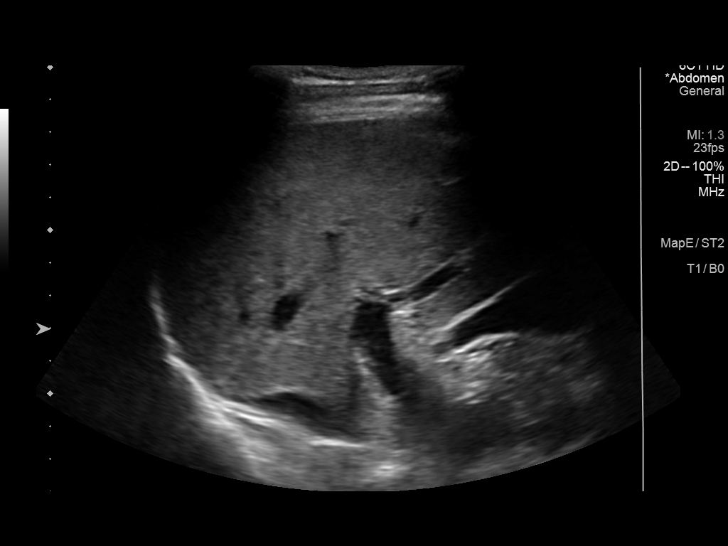
[im 43/94]
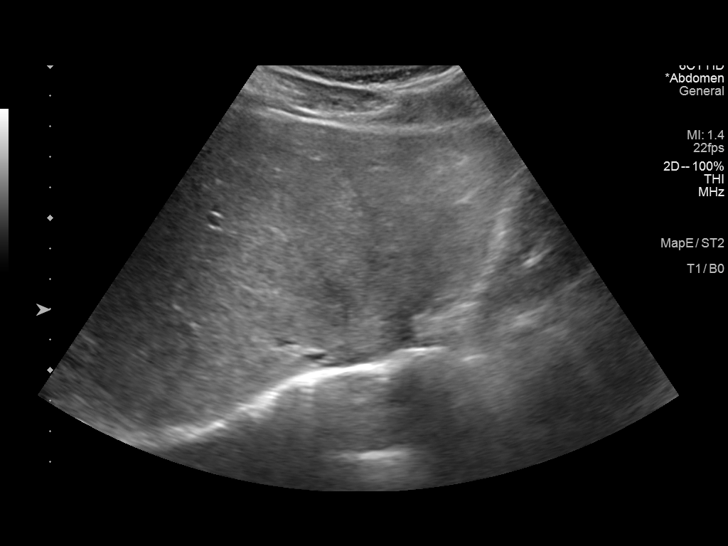
[im 51/94]
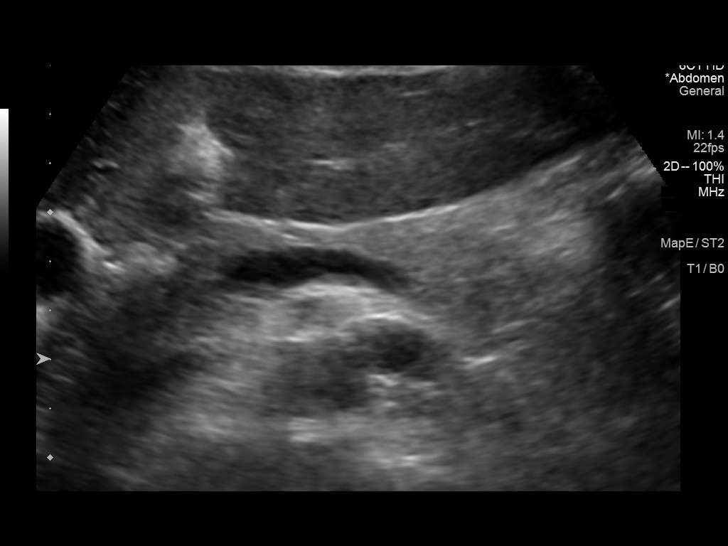
[im 59/94]
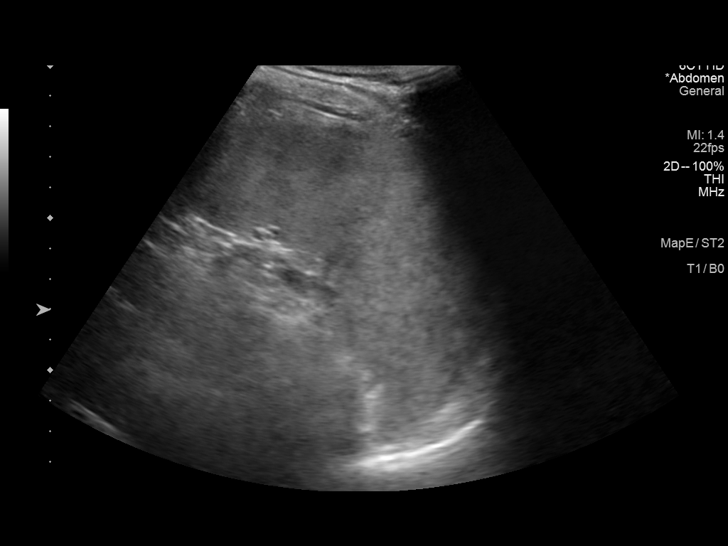
[im 63/94]
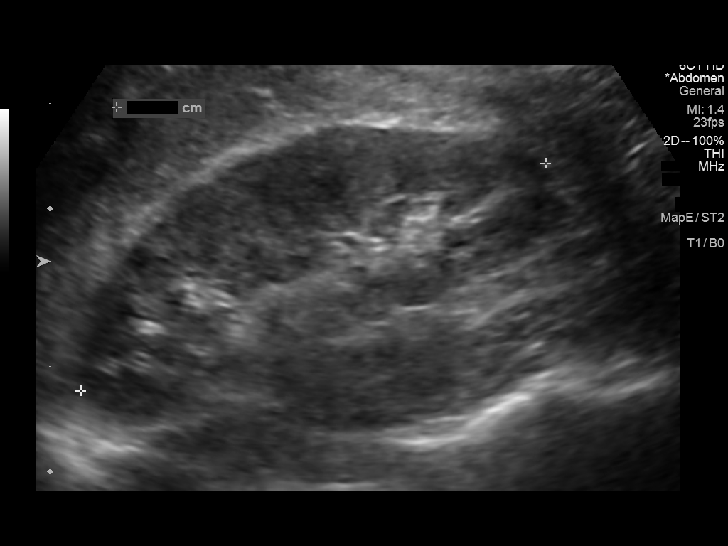
[im 70/94]
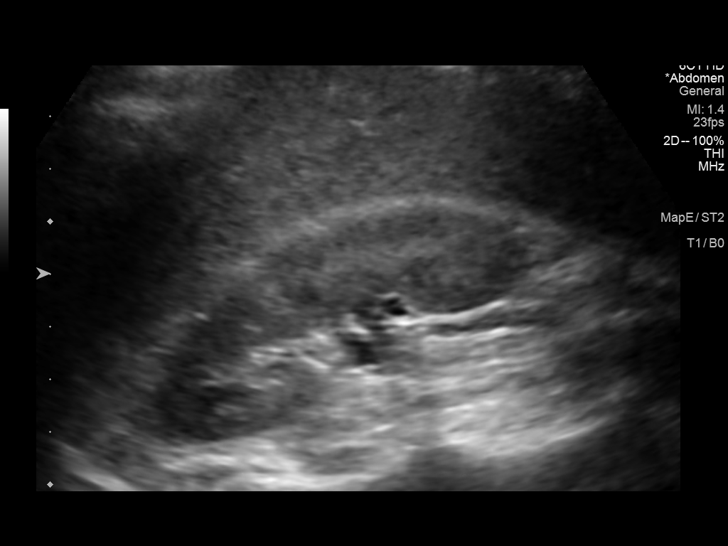
[im 78/94]
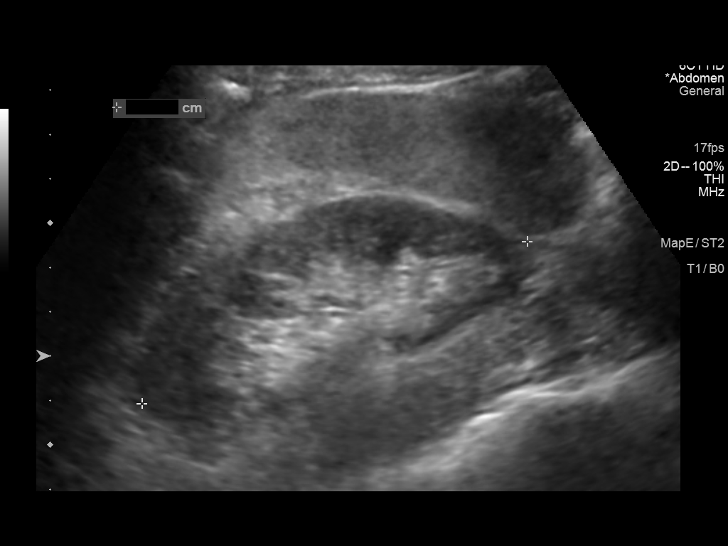
[im 86/94]
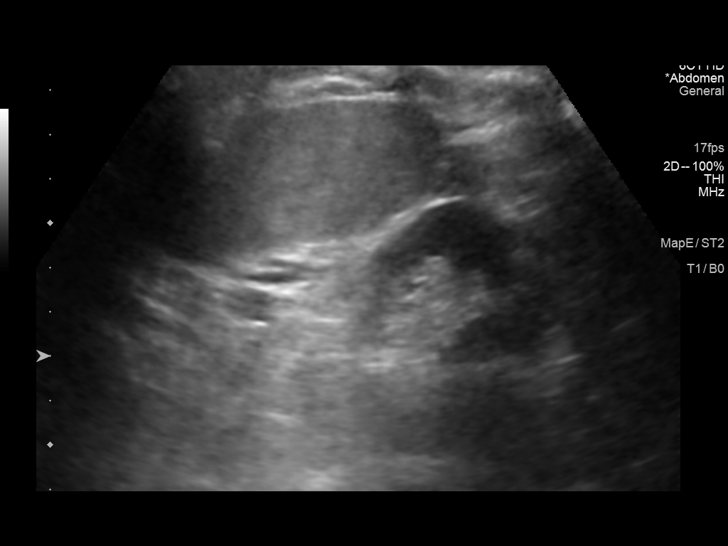
[im 94/94]
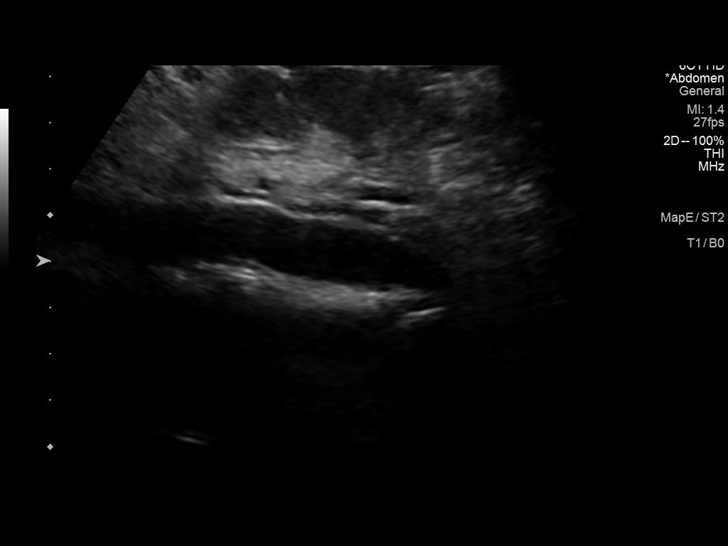

[14 of 25 positions shown; findings below may reference images not displayed]

FINDINGS: Gallbladder: No gallstones or wall thickening visualized. No
sonographic Murphy sign noted by sonographer.

Common bile duct: Diameter: 3 mm, within normal limits.

Liver: No focal lesion identified. Within normal limits in
parenchymal echogenicity. Portal vein is patent on color Doppler
imaging with normal direction of blood flow towards the liver.

IVC: No abnormality visualized.

Pancreas: Visualized portion unremarkable.

Spleen: Size and appearance within normal limits.

Right Kidney: Length: 10.1 cm. Echogenicity within normal limits. No
mass or hydronephrosis visualized.

Left Kidney: Length: 9.4 cm. Echogenicity within normal limits. No
mass or hydronephrosis visualized.

Abdominal aorta: No aneurysm visualized.

Other findings: None.
IMPRESSION: Negative abdomen ultrasound.

## 2021-09-07 ENCOUNTER — Ambulatory Visit
Admission: RE | Admit: 2021-09-07 | Discharge: 2021-09-07 | Disposition: A | Discharge status: Home / Self Care | Payer: No Typology Code available for payment source | Source: Ambulatory Visit | Attending: Nurse Practitioner | Admitting: Nurse Practitioner

## 2021-09-07 ENCOUNTER — Other Ambulatory Visit: Payer: Self-pay

## 2021-09-07 ENCOUNTER — Other Ambulatory Visit: Payer: Self-pay | Admitting: Nurse Practitioner

## 2021-09-07 DIAGNOSIS — Z021 Encounter for pre-employment examination: Secondary | ICD-10-CM

## 2022-06-12 ENCOUNTER — Ambulatory Visit (INDEPENDENT_AMBULATORY_CARE_PROVIDER_SITE_OTHER): Payer: 59 | Admitting: Ophthalmology

## 2022-06-12 ENCOUNTER — Encounter (INDEPENDENT_AMBULATORY_CARE_PROVIDER_SITE_OTHER): Payer: Self-pay | Admitting: Ophthalmology

## 2022-06-12 DIAGNOSIS — H43393 Other vitreous opacities, bilateral: Secondary | ICD-10-CM | POA: Diagnosis not present
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
## 2023-06-02 ENCOUNTER — Encounter (HOSPITAL_COMMUNITY): Payer: Self-pay

## 2023-06-02 ENCOUNTER — Emergency Department (HOSPITAL_COMMUNITY)
Admission: EM | Admit: 2023-06-02 | Discharge: 2023-06-02 | Disposition: A | Discharge status: Home / Self Care | Attending: Emergency Medicine | Admitting: Emergency Medicine

## 2023-06-02 DIAGNOSIS — M545 Low back pain, unspecified: Secondary | ICD-10-CM | POA: Diagnosis not present

## 2023-06-02 MED ORDER — KETOROLAC TROMETHAMINE 30 MG/ML IJ SOLN
30.0000 mg | Freq: Once | INTRAMUSCULAR | Status: AC
  Administered 2023-06-02: 30 mg via INTRAMUSCULAR
  Filled 2023-06-02: qty 1

## 2023-06-02 MED ORDER — METHOCARBAMOL 500 MG PO TABS: 500.0000 mg | ORAL_TABLET | Freq: Two times a day (BID) | ORAL | 0 refills | Status: AC

## 2023-06-02 MED ORDER — DEXAMETHASONE SODIUM PHOSPHATE 10 MG/ML IJ SOLN
10.0000 mg | Freq: Once | INTRAMUSCULAR | Status: AC
  Administered 2023-06-02: 10 mg via INTRAMUSCULAR
  Filled 2023-06-02: qty 1
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
## 2023-06-02 NOTE — Discharge Instructions (Addendum)
You were seen in the emergency department today for back pain.   Rest, gentle exercise and stretching will aid in recovery from any injuries to your back.    We gave you injections of toradol (anti-inflammatory) and steroids.   Using medication such as Tylenol and ibuprofen will help alleviate pain as well as decrease swelling and inflammation associated with these injuries. You may use 600 mg ibuprofen every 6 hours or 1000 mg of Tylenol every 6 hours.  You may choose to alternate between the 2.  This would be most effective.  Not to exceed 4 g of Tylenol within 24 hours.  Not to exceed 3200 mg ibuprofen 24 hours.  I have prescribed you: muscle relaxer that you can take twice daily as needed.  Salt water/Epson salt soaks, massage, icy hot/Biofreeze/BenGay and other similar products can help with symptoms.  Please return to the emergency department for reevaluation if you denies any new or concerning symptoms such as fever, new numbness or weakness in your legs, difficulty using the restroom or incontinence.

## 2023-07-21 ENCOUNTER — Emergency Department (HOSPITAL_COMMUNITY)
Admission: EM | Admit: 2023-07-21 | Discharge: 2023-07-21 | Disposition: A | Discharge status: Home / Self Care | Payer: Worker's Compensation | Attending: Emergency Medicine | Admitting: Emergency Medicine

## 2023-07-21 ENCOUNTER — Encounter (HOSPITAL_COMMUNITY): Payer: Self-pay

## 2023-07-21 DIAGNOSIS — Y990 Civilian activity done for income or pay: Secondary | ICD-10-CM | POA: Insufficient documentation

## 2023-07-21 DIAGNOSIS — S6992XA Unspecified injury of left wrist, hand and finger(s), initial encounter: Secondary | ICD-10-CM | POA: Diagnosis present

## 2023-07-21 DIAGNOSIS — S60312A Abrasion of left thumb, initial encounter: Secondary | ICD-10-CM | POA: Insufficient documentation

## 2023-07-21 DIAGNOSIS — W503XXA Accidental bite by another person, initial encounter: Secondary | ICD-10-CM | POA: Insufficient documentation

## 2023-07-21 MED ORDER — AMOXICILLIN-POT CLAVULANATE 875-125 MG PO TABS: 1.0000 | ORAL_TABLET | Freq: Two times a day (BID) | ORAL | 0 refills | Status: AC

## 2023-07-21 MED ORDER — AMOXICILLIN-POT CLAVULANATE 875-125 MG PO TABS
1.0000 | ORAL_TABLET | Freq: Once | ORAL | Status: AC
  Administered 2023-07-21: 1 via ORAL
  Filled 2023-07-21: qty 1
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
## 2023-07-21 NOTE — Discharge Instructions (Signed)
Please follow-up with your primary care doctor as needed.  Your tetanus is up-to-date, please take the antibiotics as prescribed please.  If it becomes red, swollen or tender return to the ER immediately.
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
